# Patient Record
Sex: Male | Born: 1975 | Race: White | Hispanic: No | Marital: Married | Smoking: Never smoker
Health system: Southern US, Community
[De-identification: ages and names within clinical notes are randomized; demographics above are authoritative.]

## PROBLEM LIST (undated history)

## (undated) DIAGNOSIS — K219 Gastro-esophageal reflux disease without esophagitis: Secondary | ICD-10-CM

## (undated) DIAGNOSIS — E119 Type 2 diabetes mellitus without complications: Secondary | ICD-10-CM

## (undated) DIAGNOSIS — Q780 Osteogenesis imperfecta: Secondary | ICD-10-CM

## (undated) HISTORY — PX: KNEE SURGERY: SHX244

## (undated) HISTORY — PX: ANKLE SURGERY: SHX546

## (undated) HISTORY — PX: ELBOW SURGERY: SHX618

---

## 2015-01-14 ENCOUNTER — Ambulatory Visit (INDEPENDENT_AMBULATORY_CARE_PROVIDER_SITE_OTHER): Payer: Self-pay

## 2015-01-14 ENCOUNTER — Other Ambulatory Visit: Payer: Self-pay | Admitting: Adult Health

## 2015-01-14 DIAGNOSIS — R52 Pain, unspecified: Secondary | ICD-10-CM

## 2015-01-14 DIAGNOSIS — M549 Dorsalgia, unspecified: Secondary | ICD-10-CM

## 2015-02-01 ENCOUNTER — Emergency Department (INDEPENDENT_AMBULATORY_CARE_PROVIDER_SITE_OTHER): Payer: Worker's Compensation

## 2015-02-01 ENCOUNTER — Encounter: Payer: Self-pay | Admitting: Emergency Medicine

## 2015-02-01 ENCOUNTER — Emergency Department (INDEPENDENT_AMBULATORY_CARE_PROVIDER_SITE_OTHER)
Admission: EM | Admit: 2015-02-01 | Discharge: 2015-02-01 | Disposition: A | Discharge status: Home / Self Care | Payer: Worker's Compensation | Source: Home / Self Care | Attending: Emergency Medicine | Admitting: Emergency Medicine

## 2015-02-01 DIAGNOSIS — M5441 Lumbago with sciatica, right side: Secondary | ICD-10-CM

## 2015-02-01 DIAGNOSIS — M5416 Radiculopathy, lumbar region: Secondary | ICD-10-CM

## 2015-02-01 DIAGNOSIS — M5417 Radiculopathy, lumbosacral region: Secondary | ICD-10-CM | POA: Diagnosis not present

## 2015-02-01 DIAGNOSIS — M4186 Other forms of scoliosis, lumbar region: Secondary | ICD-10-CM

## 2015-02-01 HISTORY — DX: Osteogenesis imperfecta: Q78.0

## 2015-02-01 MED ORDER — KETOROLAC TROMETHAMINE 60 MG/2ML IM SOLN
60.0000 mg | Freq: Once | INTRAMUSCULAR | Status: AC
  Administered 2015-02-01: 60 mg via INTRAMUSCULAR

## 2015-02-01 MED ORDER — MELOXICAM 7.5 MG PO TABS: ORAL_TABLET | ORAL | Status: AC

## 2015-02-01 MED ORDER — PREDNISONE (PAK) 10 MG PO TABS: ORAL_TABLET | ORAL | Status: AC

## 2015-02-01 MED ORDER — CYCLOBENZAPRINE HCL 10 MG PO TABS: 10.0000 mg | ORAL_TABLET | Freq: Three times a day (TID) | ORAL | Status: DC | PRN

## 2015-02-01 MED ORDER — HYDROCODONE-ACETAMINOPHEN 5-325 MG PO TABS: 1.0000 | ORAL_TABLET | ORAL | Status: AC | PRN

## 2015-02-01 MED ORDER — METHYLPREDNISOLONE ACETATE 80 MG/ML IJ SUSP
80.0000 mg | Freq: Once | INTRAMUSCULAR | Status: AC
  Administered 2015-02-01: 80 mg via INTRAMUSCULAR

## 2015-02-01 NOTE — ED Provider Notes (Signed)
CSN: [id number]     Arrival date & time 02/01/15  1713 History   First MD Initiated Contact with Patient 02/01/15 1717     Chief Complaint  Patient presents with  . Back Pain   Worker's Comp.--Employee of [employer] [facility]:30 [facility]. His wife brings him in. Marland Kitchen. HPI Complicated history. [facility] [employer] Visits, 3/15, 3/17, and 01/21/2015 in [facility].  Date of injury: 01/13/2015 Original injury: 01/13/2015, was working as Proofreader"bulk customer representative", and he was moving a case of 2 L from pallet that was waist high. He sustained immediate right lumbar pain  Patient had back injury at work on 01-13-15; came for evaluation in employer health services 01-14-15, which included xrays of LS-spine showing no acute abnormality. He improved with conservative treatment with medication regime with subsequent back exercises; he gradually improved and was discharged to work on 01/21/2015. Ever since 01/21/2015, he has continued to work with lifting and stacking cases of [employer] and was doing fairly well, although he states the right lumbar pain never completely went away. Up until 2 days ago, he was able to work and function without any significant pain or any neurologic symptoms and was not taking any pain medicine or muscle relaxant at that point.  Then, over past 2 days, complains of worsening midline lumbar pain and right lumbar pain, radiating to right knee. Associated with vague numbness down right leg to right foot.  Currently, lumbar pain is dull, constant, 7 out of 10 intensity at rest. If he bends or twists, the lumbar pain becomes worse, 9 out of 10 intensity, feels sharp radiating pain down right leg to right knee. He resume taking Flexeril last night, and that helped muscle spasm mildly. Took Tylenol this morning without significant relief.  Associated symptoms: He denies bowel or bladder  dysfunction. No chest pain or shortness of breath. No fever or chills or nausea or vomiting or abdominal pain. Past Medical History  Diagnosis Date  . Osteogenesis imperfecta     mild   Past Surgical History  Procedure Laterality Date  . Knee surgery    . Ankle surgery    . Elbow surgery Left    Family History  Problem Relation Age of Onset  . Diabetes Father    History  Substance Use Topics  . Smoking status: Never Smoker   . Smokeless tobacco: Not on file  . Alcohol Use: No    Review of Systems  All other systems reviewed and are negative.   Allergies  Review of patient's allergies indicates no known allergies.  Home Medications   Prior to Admission medications   Medication Sig Start Date End Date Taking? Authorizing Provider  cyclobenzaprine (FLEXERIL) 10 MG tablet Take 1 tablet (10 mg total) by mouth 3 (three) times daily as needed for muscle spasms. Caution-may cause drowsiness 02/01/15   Lajean Manesavid Massey, MD  HYDROcodone-acetaminophen (NORCO/VICODIN) 5-325 MG per tablet Take 1-2 tablets by mouth every 4 (four) hours as needed for severe pain. Take with food. Caution-may cause drowsiness 02/01/15   Lajean Manesavid Massey, MD  meloxicam (MOBIC) 7.5 MG tablet Take 1 twice a day as needed for pain. Take with food. (Do not take with any other NSAID.) 02/01/15   Lajean Manesavid Massey, MD  predniSONE (STERAPRED UNI-PAK) 10 MG tablet Starting Sunday 02/02/2015. Take as directed for 6 days.--Take 6 on day 1, 5 on day 2, 4 on day 3, then 3  tablets on day 4, then 2 tablets on day 5, then 1 on day 6. 02/01/15   Lajean Manes, MD   BP 112/72 mmHg  Pulse 86  Temp(Src) 97.9 F (36.6 C) (Oral)  Resp 6  SpO2 100% Physical Exam  Constitutional: He appears well-developed and well-nourished. He is cooperative.  Non-toxic appearance. He appears distressed (Appears uncomfortable from low back pain.).  In severe pain from mid lumbar and right lumbar pain. Walks hunched over in severe distress from the pain.  HENT:   Head: Normocephalic and atraumatic.  Mouth/Throat: Oropharynx is clear and moist.  Eyes: EOM are normal. Pupils are equal, round, and reactive to light. No scleral icterus.  Neck: Neck supple.  Cardiovascular: Regular rhythm and normal heart sounds.   Pulmonary/Chest: Effort normal and breath sounds normal. No respiratory distress. He has no wheezes. He has no rales. He exhibits no tenderness.  Abdominal: Soft. There is no tenderness.  Musculoskeletal:       Right hip: Normal.       Left hip: Normal.       Cervical back: He exhibits no tenderness.       Thoracic back: He exhibits no tenderness.       Lumbar back: He exhibits decreased range of motion, tenderness, bony tenderness and spasm. He exhibits no swelling, no edema, no deformity, no laceration and normal pulse.  POSITIVE Right straight leg-raise test. Negative Left straight leg-raise test.  Negative Right Patrick test. Negative Left Luisa Hart test.  Very tender with spasm right paralumbar muscles. Very tender right sciatic notch.    Neurological: He is alert. He has normal strength. He displays no atrophy, no tremor and normal reflexes. No cranial nerve deficit or sensory deficit. He exhibits normal muscle tone. Gait abnormal.  Reflex Scores:      Patellar reflexes are 2+ on the right side and 2+ on the left side.      Achilles reflexes are 1+ on the right side and 1+ on the left side. Skin: Skin is warm, dry and intact. No lesion and no rash noted.  Psychiatric: He has a normal mood and affect.  Nursing note and vitals reviewed.   ED Course  Procedures (including critical care time)  Imaging Review Dg Lumbar Spine Complete  02/01/2015   CLINICAL DATA:  Back injury at work on 01/13/2015, now with recurrent pain and right lower extremity radiculopathy beginning yesterday.  EXAM: LUMBAR SPINE - COMPLETE 4+ VIEW  COMPARISON:  01/14/2015  FINDINGS: There is moderate left convex curvature centered at L3. The curvature is  worsened from 01/13/2015. The lumbar vertebrae are normal in height. There is excellent preservation of intervertebral disc spaces. There is no spondylolysis or spondylolisthesis. Sacroiliac joints appear unremarkable. There is no fracture. There is no bone lesion or bony destruction  IMPRESSION: Moderate left convex curvature.   Electronically Signed   By: Ellery Plunk M.D.   On: 02/01/2015 18:30     MDM   1. Acute right-sided back pain with sciatica   2. Lumbar back pain with radiculopathy affecting right lower extremity    although he has no definite neurologic deficits, he has severe lumbar pain, radiating to right leg, consistent with radiculopathy and sciatica.  X-ray LS-spine ordered 5:45 pm. Reviewed with patient and wife that x-ray LS-spine shows no acute bony abnormalities. There is excellent preservation of intervertebral disc spaces. No fracture. There is moderate left convex curvature, which in my opinion is consistent with severe lumbar spasm. Treatment options discussed,  as well as risks, benefits, alternatives. Patient voiced understanding and agreement with the following plans:  Depo-Medrol 80 IM stat Toradol 60 mg IM stat.  Discharge Medication List as of 02/01/2015  6:29 PM    START taking these medications   Details  cyclobenzaprine (FLEXERIL) 10 MG tablet Take 1 tablet (10 mg total) by mouth 3 (three) times daily as needed for muscle spasms. Caution-may cause drowsiness, Starting 02/01/2015, Until Discontinued, Print    HYDROcodone-acetaminophen (NORCO/VICODIN) 5-325 MG per tablet Take 1-2 tablets by mouth every 4 (four) hours as needed for severe pain. Take with food. Caution-may cause drowsiness, Starting 02/01/2015, Until Discontinued, Print    meloxicam (MOBIC) 7.5 MG tablet Take 1 twice a day as needed for pain. Take with food. (Do not take with any other NSAID.), Print    predniSONE (STERAPRED UNI-PAK) 10 MG tablet Starting Sunday 02/02/2015. Take as directed for 6  days.--Take 6 on day 1, 5 on day 2, 4 on day 3, then 3 tablets on day 4, then 2 tablets on day 5, then 1 on day 6., Print       alternate ice and heat. Completed the abbreviated Worker's Comp. physician worksheet-urgent care form. Gave patient a copy of this. Return to work: Undetermined. No work until evaluated by specialist. I wrote a handwritten referral on prescription pad indicating referral to orthopedist within 5 days because of severe right sciatica in severe lumbar pain radiating to right leg. Advised him to contact employer health services on Monday 02/03/2015, who will help arrange referral to orthopedist.  Over 45 minutes spent, greater than 50% of the time spent for counseling and coordination of care. Precautions discussed. Red flags discussed. Questions invited and answered. Patient voiced understanding and agreement.    Lajean Manes, MD 02/01/15 1919

## 2015-02-01 NOTE — ED Notes (Signed)
Patient had back injury at work on 01-13-15; came for evaluation in [facility] 01-14-15, which included xrays of area and medication regime with subsequent exercises; was discharged to return to work 10 days ago. Has not been taking rx for flexeril and diazepam until back began hurting last night; shooting down right leg; making all movements painful; took flexeril last night and tylenol this morning.

## 2015-02-27 ENCOUNTER — Other Ambulatory Visit: Payer: Self-pay | Admitting: Neurological Surgery

## 2015-02-27 DIAGNOSIS — M5126 Other intervertebral disc displacement, lumbar region: Secondary | ICD-10-CM

## 2015-03-04 ENCOUNTER — Ambulatory Visit
Admission: RE | Admit: 2015-03-04 | Discharge: 2015-03-04 | Disposition: A | Discharge status: Home / Self Care | Payer: Worker's Compensation | Source: Ambulatory Visit | Attending: Neurological Surgery | Admitting: Neurological Surgery

## 2015-03-04 DIAGNOSIS — M5126 Other intervertebral disc displacement, lumbar region: Secondary | ICD-10-CM

## 2015-03-04 MED ORDER — METHYLPREDNISOLONE ACETATE 40 MG/ML INJ SUSP (RADIOLOG
120.0000 mg | Freq: Once | INTRAMUSCULAR | Status: AC
  Administered 2015-03-04: 120 mg via EPIDURAL

## 2015-03-04 MED ORDER — IOHEXOL 180 MG/ML  SOLN
1.0000 mL | Freq: Once | INTRAMUSCULAR | Status: AC | PRN
  Administered 2015-03-04: 1 mL via EPIDURAL

## 2015-03-04 NOTE — Discharge Instructions (Signed)

## 2016-08-16 ENCOUNTER — Ambulatory Visit (INDEPENDENT_AMBULATORY_CARE_PROVIDER_SITE_OTHER): Payer: PRIVATE HEALTH INSURANCE | Admitting: Podiatry

## 2016-08-16 VITALS — BP 169/92 | HR 73 | Resp 14

## 2016-08-16 DIAGNOSIS — M79676 Pain in unspecified toe(s): Secondary | ICD-10-CM

## 2016-08-16 DIAGNOSIS — W450XXA Nail entering through skin, initial encounter: Secondary | ICD-10-CM | POA: Diagnosis not present

## 2016-08-16 NOTE — Progress Notes (Signed)
   Subjective:    Patient ID: Albert Burnett, male    DOB: 10/28/1976, 40 y.o.   MRN: [id number]  HPI this patient presents to the office with chief complaint of a great toenail that is unattached from the nail bed. He says he was working at [employer] and a pallet fell onto his toe. He says that he found bleeding noted at the site of the injury 4 weeks ago, but there was minimal pain now. He presents saying that the hole nail has become loose and he is concerned about catching his nail. during the course of the day. He presents the office today for an evaluation and treatment of his big toenail, left foot    Review of Systems  All other systems reviewed and are negative.      Objective:   Physical Exam GENERAL APPEARANCE: Alert, conversant. Appropriately groomed. No acute distress.  VASCULAR: Pedal pulses are  palpable at  [facility] bilateral.  Capillary refill time is immediate to all digits,  Normal temperature gradient.  Digital hair growth is present bilateral  NEUROLOGIC: sensation is normal to 5.07 monofilament at 5/5 sites bilateral.  Light touch is intact bilateral, Muscle strength normal.  MUSCULOSKELETAL: acceptable muscle strength, tone and stability bilateral.  Intrinsic muscluature intact bilateral.  Mild DJD 1st MPJ  Right foot  DERMATOLOGIC: skin color, texture, and turgor are within normal limits.  No preulcerative lesions or ulcers  are seen, no interdigital maceration noted.  No open lesions present.  . No drainage noted. Left hallux nail plate unattached to nail bed.         Assessment & Plan:  Injured toenail left foot   IE  Removal of unattached nail.  Told to peroxide wash.   Helane GuntherGregory Mayer DPM

## 2017-06-01 ENCOUNTER — Ambulatory Visit (INDEPENDENT_AMBULATORY_CARE_PROVIDER_SITE_OTHER): Payer: Worker's Compensation

## 2017-06-01 ENCOUNTER — Other Ambulatory Visit: Payer: Self-pay | Admitting: Emergency Medicine

## 2017-06-01 DIAGNOSIS — M5441 Lumbago with sciatica, right side: Secondary | ICD-10-CM

## 2017-06-01 DIAGNOSIS — M545 Low back pain: Secondary | ICD-10-CM | POA: Diagnosis not present

## 2017-07-13 ENCOUNTER — Other Ambulatory Visit: Payer: Self-pay | Admitting: Neurological Surgery

## 2017-07-13 DIAGNOSIS — M5126 Other intervertebral disc displacement, lumbar region: Secondary | ICD-10-CM

## 2017-07-18 ENCOUNTER — Ambulatory Visit
Admission: RE | Admit: 2017-07-18 | Discharge: 2017-07-18 | Disposition: A | Discharge status: Home / Self Care | Payer: Worker's Compensation | Source: Ambulatory Visit | Attending: Neurological Surgery | Admitting: Neurological Surgery

## 2017-07-18 DIAGNOSIS — M5126 Other intervertebral disc displacement, lumbar region: Secondary | ICD-10-CM

## 2017-07-18 MED ORDER — IOPAMIDOL (ISOVUE-M 200) INJECTION 41%
1.0000 mL | Freq: Once | INTRAMUSCULAR | Status: AC
  Administered 2017-07-18: 1 mL via EPIDURAL

## 2017-07-18 MED ORDER — METHYLPREDNISOLONE ACETATE 40 MG/ML INJ SUSP (RADIOLOG
120.0000 mg | Freq: Once | INTRAMUSCULAR | Status: AC
  Administered 2017-07-18: 120 mg via EPIDURAL

## 2017-07-18 NOTE — Discharge Instructions (Signed)

## 2017-09-20 ENCOUNTER — Other Ambulatory Visit: Payer: Self-pay | Admitting: Neurological Surgery

## 2017-09-20 DIAGNOSIS — M5126 Other intervertebral disc displacement, lumbar region: Secondary | ICD-10-CM

## 2017-10-06 ENCOUNTER — Ambulatory Visit
Admission: RE | Admit: 2017-10-06 | Discharge: 2017-10-06 | Disposition: A | Discharge status: Home / Self Care | Payer: Worker's Compensation | Source: Ambulatory Visit | Attending: Neurological Surgery | Admitting: Neurological Surgery

## 2017-10-06 DIAGNOSIS — M5126 Other intervertebral disc displacement, lumbar region: Secondary | ICD-10-CM

## 2017-10-06 MED ORDER — METHYLPREDNISOLONE ACETATE 40 MG/ML INJ SUSP (RADIOLOG
120.0000 mg | Freq: Once | INTRAMUSCULAR | Status: AC
  Administered 2017-10-06: 120 mg via EPIDURAL

## 2017-10-06 MED ORDER — IOPAMIDOL (ISOVUE-M 200) INJECTION 41%
1.0000 mL | Freq: Once | INTRAMUSCULAR | Status: AC
  Administered 2017-10-06: 1 mL via EPIDURAL

## 2017-10-06 NOTE — Discharge Instructions (Signed)

## 2018-08-30 IMAGING — DX DG LUMBAR SPINE 2-3V
3 series · 3 of 3 positions shown · non-contrast
Comparison: 02/13/2015

CLINICAL DATA: Low back pain for 5 days

EXAM:
LUMBAR SPINE - 2-3 VIEW

[l-spine ap]
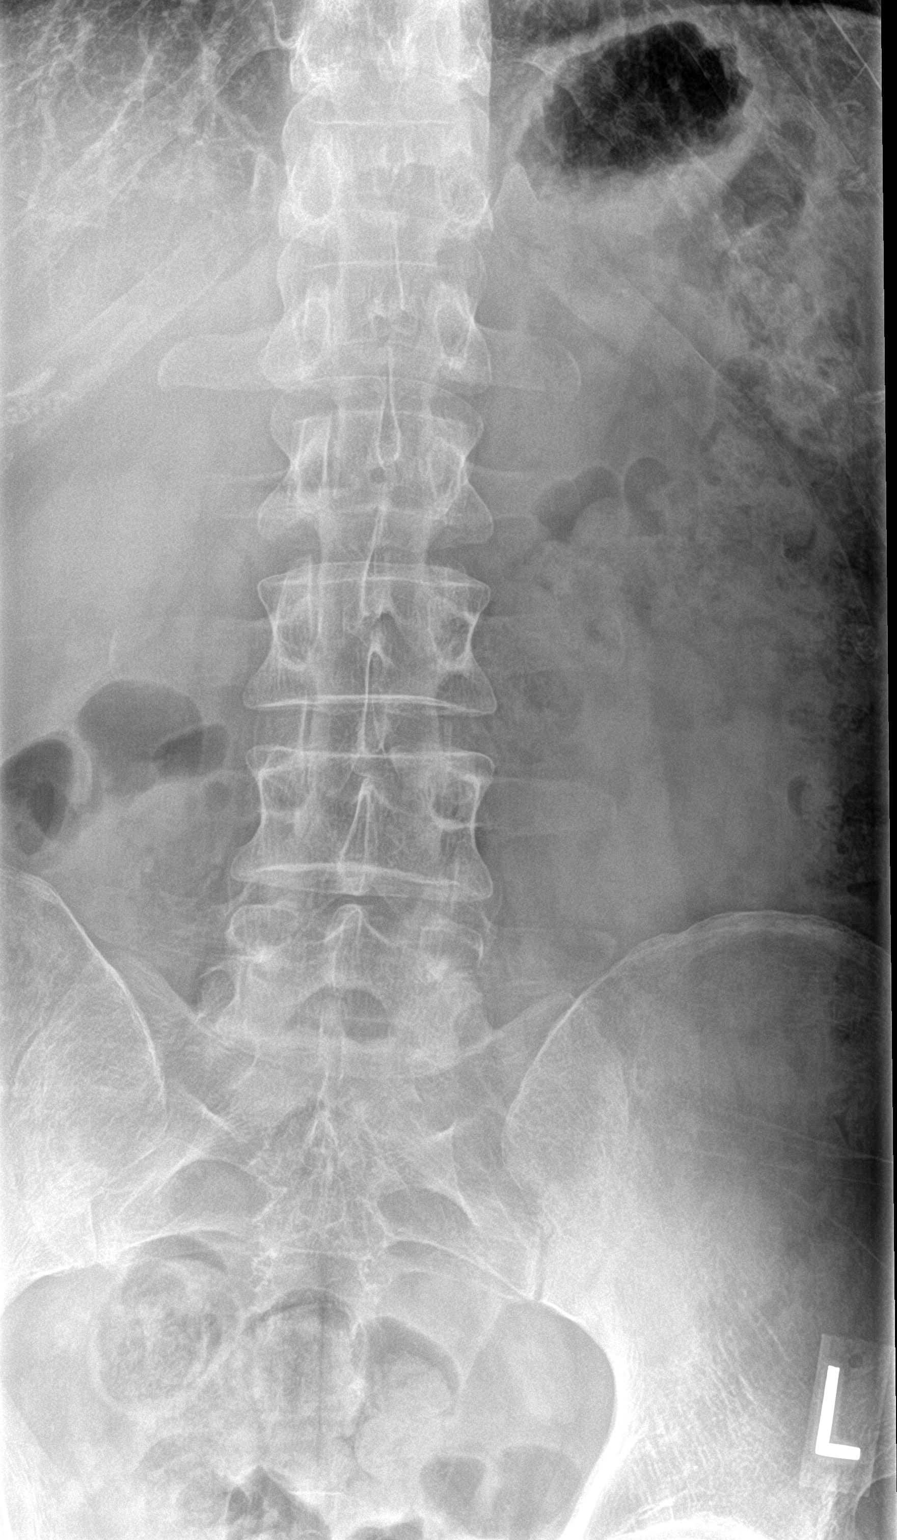

[l-spine lat]
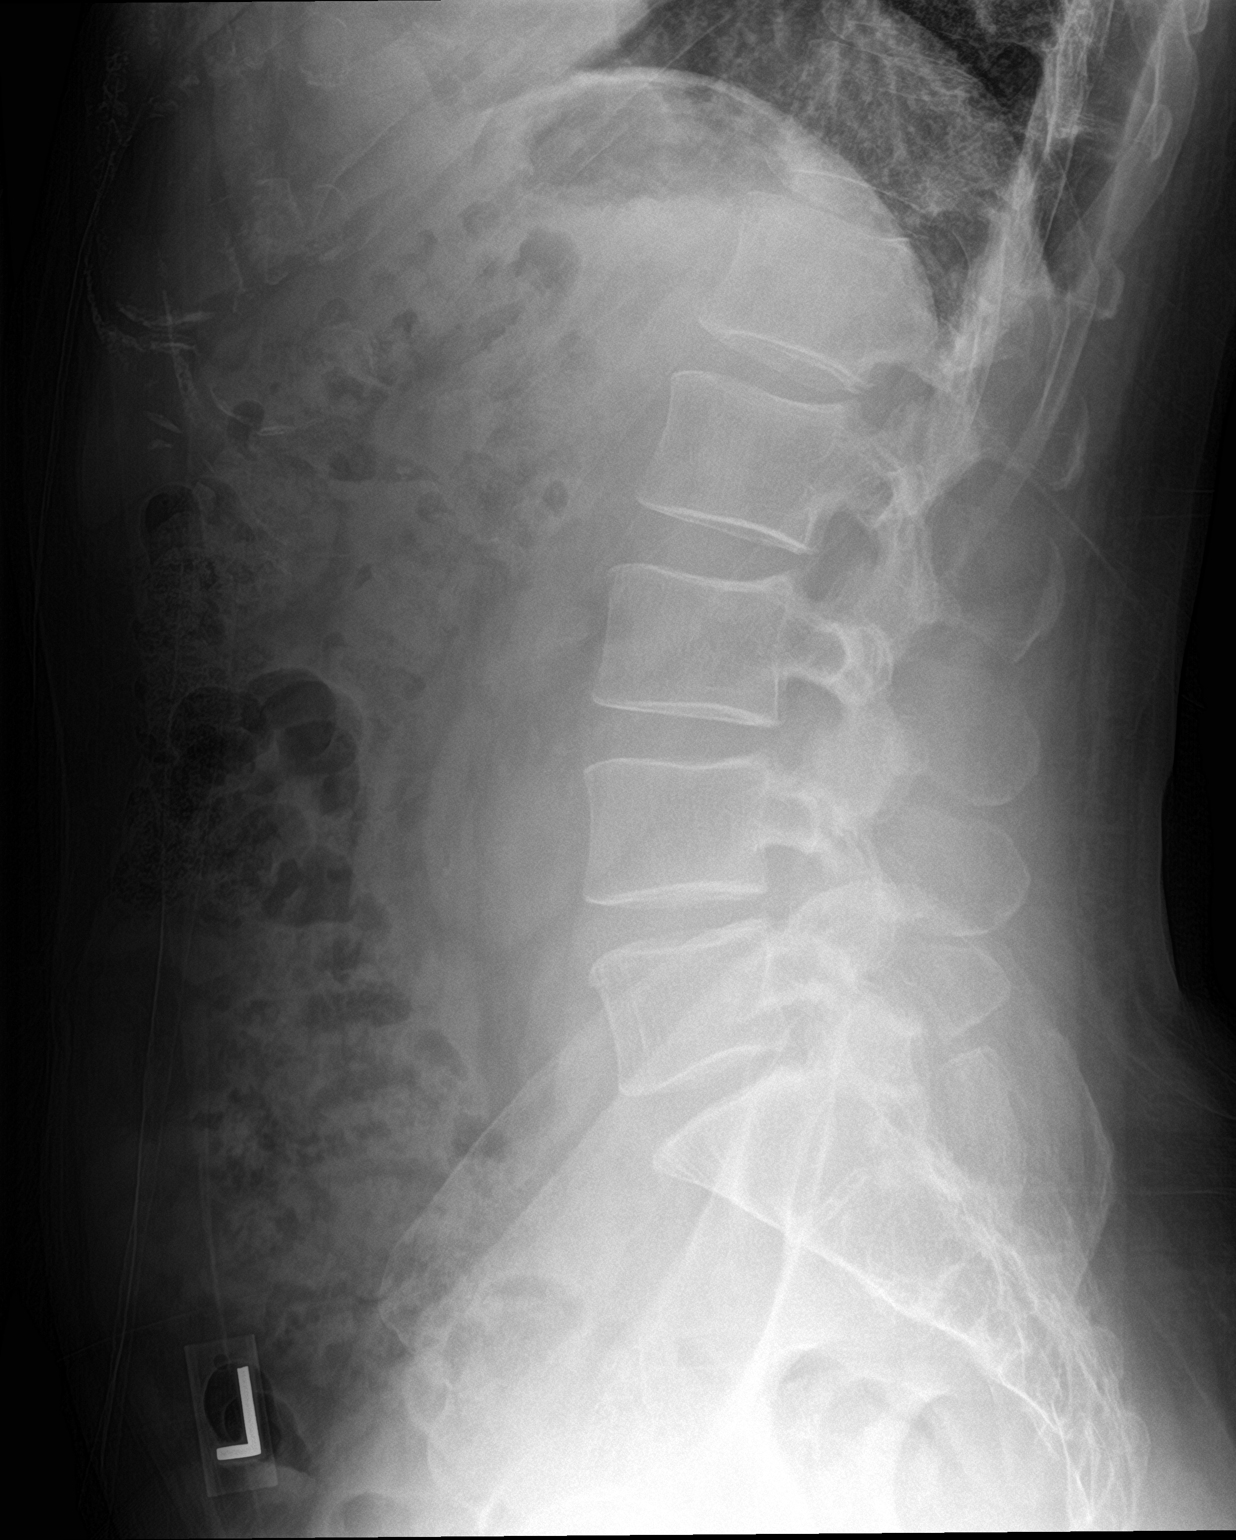

[l-spine spot]
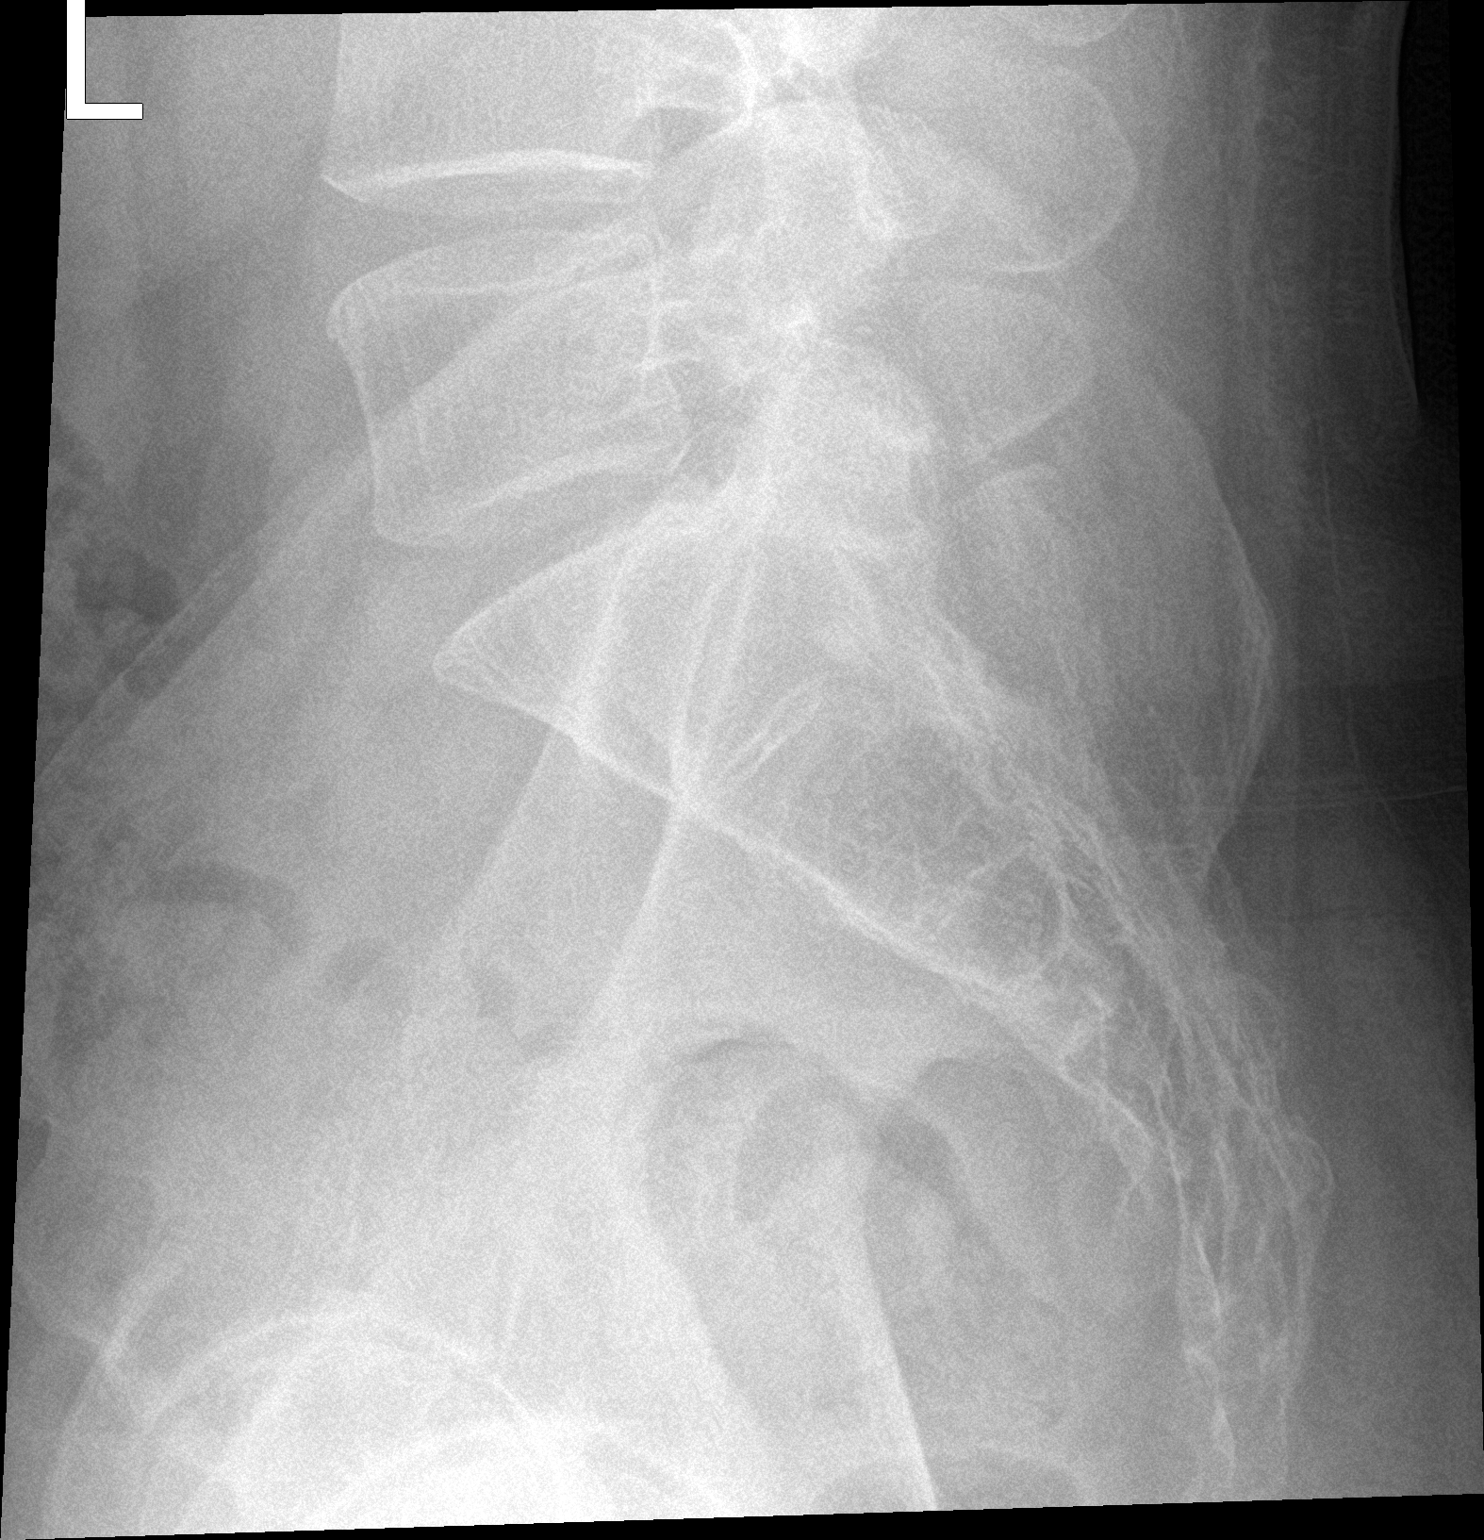

[3 of 3 positions shown; findings below may reference images not displayed]

FINDINGS: Anatomic alignment. No vertebral compression. Moderate narrowing of
the L4-5 and L5-S1 discs. No definite fracture. There is facet
arthropathy at L4-5 and L5-S1.
IMPRESSION: No acute bony pathology.  Degenerative change.

## 2021-06-19 ENCOUNTER — Ambulatory Visit: Payer: Commercial Managed Care - PPO | Admitting: Sports Medicine

## 2021-06-19 ENCOUNTER — Other Ambulatory Visit: Payer: Self-pay

## 2021-06-19 ENCOUNTER — Encounter: Payer: Self-pay | Admitting: Sports Medicine

## 2021-06-19 DIAGNOSIS — L0390 Cellulitis, unspecified: Secondary | ICD-10-CM | POA: Insufficient documentation

## 2021-06-19 DIAGNOSIS — K5900 Constipation, unspecified: Secondary | ICD-10-CM | POA: Insufficient documentation

## 2021-06-19 DIAGNOSIS — G43019 Migraine without aura, intractable, without status migrainosus: Secondary | ICD-10-CM | POA: Insufficient documentation

## 2021-06-19 DIAGNOSIS — K921 Melena: Secondary | ICD-10-CM | POA: Insufficient documentation

## 2021-06-19 DIAGNOSIS — M2041 Other hammer toe(s) (acquired), right foot: Secondary | ICD-10-CM

## 2021-06-19 DIAGNOSIS — E782 Mixed hyperlipidemia: Secondary | ICD-10-CM | POA: Insufficient documentation

## 2021-06-19 DIAGNOSIS — H9190 Unspecified hearing loss, unspecified ear: Secondary | ICD-10-CM | POA: Insufficient documentation

## 2021-06-19 DIAGNOSIS — M2042 Other hammer toe(s) (acquired), left foot: Secondary | ICD-10-CM

## 2021-06-19 DIAGNOSIS — E119 Type 2 diabetes mellitus without complications: Secondary | ICD-10-CM | POA: Insufficient documentation

## 2021-06-19 DIAGNOSIS — U071 COVID-19: Secondary | ICD-10-CM | POA: Insufficient documentation

## 2021-06-19 DIAGNOSIS — M5416 Radiculopathy, lumbar region: Secondary | ICD-10-CM | POA: Diagnosis not present

## 2021-06-19 DIAGNOSIS — W57XXXA Bitten or stung by nonvenomous insect and other nonvenomous arthropods, initial encounter: Secondary | ICD-10-CM | POA: Insufficient documentation

## 2021-06-19 DIAGNOSIS — K219 Gastro-esophageal reflux disease without esophagitis: Secondary | ICD-10-CM | POA: Insufficient documentation

## 2021-06-19 DIAGNOSIS — G629 Polyneuropathy, unspecified: Secondary | ICD-10-CM | POA: Diagnosis not present

## 2021-06-19 DIAGNOSIS — Z0000 Encounter for general adult medical examination without abnormal findings: Secondary | ICD-10-CM | POA: Insufficient documentation

## 2021-06-19 DIAGNOSIS — E559 Vitamin D deficiency, unspecified: Secondary | ICD-10-CM | POA: Insufficient documentation

## 2021-06-19 DIAGNOSIS — D518 Other vitamin B12 deficiency anemias: Secondary | ICD-10-CM | POA: Insufficient documentation

## 2021-06-19 DIAGNOSIS — R7303 Prediabetes: Secondary | ICD-10-CM | POA: Diagnosis not present

## 2021-06-19 DIAGNOSIS — E039 Hypothyroidism, unspecified: Secondary | ICD-10-CM | POA: Insufficient documentation

## 2021-06-19 DIAGNOSIS — R1310 Dysphagia, unspecified: Secondary | ICD-10-CM | POA: Insufficient documentation

## 2021-06-19 DIAGNOSIS — I10 Essential (primary) hypertension: Secondary | ICD-10-CM | POA: Insufficient documentation

## 2021-06-19 DIAGNOSIS — L0291 Cutaneous abscess, unspecified: Secondary | ICD-10-CM | POA: Insufficient documentation

## 2021-06-19 NOTE — Patient Instructions (Signed)
Nervive nerve relief supplement for your nerves can be purchased OTC at [facility]  

## 2021-06-19 NOTE — Progress Notes (Signed)
Subjective: Albert Burnett is a 45 y.o. male patient who presents to office for evaluation of Right> Left foot pain. Patient complains of progressive tingling and burning to the toes for the last month. Reports that he is borderline diabetic on Metformin and hammertoes and neuropathy runs in his family. Patient denies any other pedal complaints.   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 Caution-may cause drowsiness (Patient not taking: Reported on 08/16/2016) 21 tablet 0   HYDROcodone-acetaminophen (NORCO/VICODIN) 5-325 MG per tablet Take 1-2 tablets by mouth every 4 (four) hours as needed for severe pain. Take with food. Caution-may cause drowsiness (Patient not taking: Reported on 08/16/2016) 12 tablet 0   ibuprofen (ADVIL) 800 MG tablet Take 800 mg by mouth 3 (three) times daily.     meloxicam (MOBIC) 7.5 MG tablet Take 1 twice a day as needed for pain. Take with food. (Do not take with any other NSAID.) 20  tablet 0   metFORMIN (GLUCOPHAGE) 500 MG tablet Take 500 mg by mouth daily.     Multiple Vitamin (MULTI VITAMIN) TABS 1 tablet     omeprazole (PRILOSEC) 20 MG capsule Take 20 mg by mouth daily.     predniSONE (STERAPRED UNI-PAK) 10 MG tablet Starting Sunday 02/02/2015. Take as directed for 6 days.--Take 6 on day 1, 5 on day 2, 4 on day 3, then 3 tablets on day 4, then 2 tablets on day 5, then 1 on day 6. (Patient not taking: Reported on 08/16/2016) 21 tablet 0   Vitamin D, Ergocalciferol, (DRISDOL) 1.25 MG (50000 UNIT) CAPS capsule Take 50,000 Units by mouth once a week.     No current facility-administered medications on file prior to visit.    No Known Allergies  Objective:  General: Alert and oriented x3 in no acute distress  Dermatology: Small hyperkeratotic lesion overlying 2-5 PIPJ dorsally bilateral. No open lesions bilateral lower extremities, no webspace macerations, no ecchymosis bilateral, all nails x 10 are well manicured.  Vascular: Dorsalis Pedis and Posterior Tibial pedal pulses 2/4, Capillary Fill Time 3 seconds,(+) pedal hair growth bilateral, no edema bilateral lower extremities, Temperature gradient within normal limits.  Neurology: Michaell Cowing sensation intact via light touch bilateral, Protective sensation intact  with Phoebe Perch Monofilament to all pedal sites, Vibratory diminished bilateral.    Musculoskeletal: Semi-flexible hammertoes 2-5 without tenderness. No bunion deformity noted bilateral. No pain with calf compression bilateral.  Strength within normal limits in all groups bilateral.        Assessment and Plan: Problem List Items Addressed This Visit  None Visit Diagnoses     Neuropathy    -  Primary   Hammer toes of both feet       Lumbar radiculopathy       Prediabetes          -Complete examination performed -Discussed treatment options for burning R>L foot secondary to back and history of pre-diabetes last a1c 6.2 -Recommend montoring -Recommend  OTC Nervive nerve relief vitamin -Recommend good supportive shoes daily for foot type -Patient to return to office as needed or sooner if condition worsens.  Asencion Islam, DPM

## 2022-01-18 ENCOUNTER — Encounter (HOSPITAL_BASED_OUTPATIENT_CLINIC_OR_DEPARTMENT_OTHER): Payer: Self-pay | Admitting: *Deleted

## 2022-01-18 ENCOUNTER — Other Ambulatory Visit: Payer: Self-pay

## 2022-01-18 ENCOUNTER — Emergency Department (HOSPITAL_BASED_OUTPATIENT_CLINIC_OR_DEPARTMENT_OTHER)
Admission: EM | Admit: 2022-01-18 | Discharge: 2022-01-18 | Disposition: A | Discharge status: Home / Self Care | Payer: No Typology Code available for payment source | Attending: Emergency Medicine | Admitting: Emergency Medicine

## 2022-01-18 DIAGNOSIS — R202 Paresthesia of skin: Secondary | ICD-10-CM | POA: Diagnosis not present

## 2022-01-18 DIAGNOSIS — E119 Type 2 diabetes mellitus without complications: Secondary | ICD-10-CM | POA: Insufficient documentation

## 2022-01-18 DIAGNOSIS — M545 Low back pain, unspecified: Secondary | ICD-10-CM | POA: Diagnosis present

## 2022-01-18 DIAGNOSIS — Z7984 Long term (current) use of oral hypoglycemic drugs: Secondary | ICD-10-CM | POA: Diagnosis not present

## 2022-01-18 DIAGNOSIS — M5430 Sciatica, unspecified side: Secondary | ICD-10-CM | POA: Insufficient documentation

## 2022-01-18 DIAGNOSIS — M5440 Lumbago with sciatica, unspecified side: Secondary | ICD-10-CM

## 2022-01-18 HISTORY — DX: Type 2 diabetes mellitus without complications: E11.9

## 2022-01-18 HISTORY — DX: Gastro-esophageal reflux disease without esophagitis: K21.9

## 2022-01-18 MED ORDER — CYCLOBENZAPRINE HCL 10 MG PO TABS
10.0000 mg | ORAL_TABLET | Freq: Once | ORAL | Status: AC
  Administered 2022-01-18: 10 mg via ORAL
  Filled 2022-01-18: qty 1

## 2022-01-18 MED ORDER — CYCLOBENZAPRINE HCL 10 MG PO TABS: 10.0000 mg | ORAL_TABLET | Freq: Two times a day (BID) | ORAL | 0 refills | Status: AC | PRN

## 2022-01-18 MED ORDER — KETOROLAC TROMETHAMINE 60 MG/2ML IM SOLN
60.0000 mg | Freq: Once | INTRAMUSCULAR | Status: AC
  Administered 2022-01-18: 60 mg via INTRAMUSCULAR
  Filled 2022-01-18: qty 2

## 2022-01-18 NOTE — ED Triage Notes (Signed)
Lower back, left thigh and calf pain. His foot has been "asleep" x 3 days. ?

## 2022-01-18 NOTE — ED Provider Notes (Signed)
?[facility] EMERGENCY DEPARTMENT ?Provider Note ? ? ?CSN: [id number] ?Arrival date & time: 01/18/22  1644 ? ?  ? ?History ? ?Chief Complaint  ?Patient presents with  ? Back Pain  ? ? ?Albert Burnett is a 46 y.o. male. ? ?The history is provided by the patient.  ?Back Pain ?Location:  Lumbar spine ?Quality:  Aching ?Radiates to:  L foot ?Pain severity:  Mild ?Onset quality:  Gradual ?Duration:  5 days ?Timing:  Intermittent ?Progression:  Waxing and waning ?Chronicity:  New ?Context: physical stress   ?Relieved by:  Being still (steroids) ?Worsened by:  Ambulation, bending, palpation and stress ?Associated symptoms: paresthesias and tingling   ?Associated symptoms: no abdominal pain, no abdominal swelling, no bladder incontinence, no bowel incontinence, no chest pain, no dysuria, no fever, no headaches, no pelvic pain, no perianal numbness, no weakness and no weight loss   ? ?  ? ?Home Medications ?Prior to Admission medications   ?Medication Sig Start Date End Date Taking? Authorizing Provider  ?cyclobenzaprine (FLEXERIL) 10 MG tablet Take 1 tablet (10 mg total) by mouth 2 (two) times daily as needed for muscle spasms. 01/18/22  Yes Lennice Sites, DO  ?Galcanezumab-gnlm (EMGALITY) 120 MG/ML SOAJ 1 ml 03/24/21   [provider]  ?HYDROcodone-acetaminophen (NORCO/VICODIN) 5-325 MG per tablet Take 1-2 tablets by mouth every 4 (four) hours as needed for severe pain. Take with food. Caution-may cause drowsiness ?Patient not taking: Reported on 08/16/2016 02/01/15   Jacqulyn Cane, MD  ?ibuprofen (ADVIL) 800 MG tablet Take 800 mg by mouth 3 (three) times daily. 12/23/20   [provider]  ?meloxicam (MOBIC) 7.5 MG tablet Take 1 twice a day as needed for pain. Take with food. (Do not take with any other NSAID.) 02/01/15   Jacqulyn Cane, MD  ?metFORMIN (GLUCOPHAGE) 500 MG tablet Take 500 mg by mouth daily. 06/13/21   [provider]  ?Multiple Vitamin (MULTI VITAMIN) TABS 1 tablet    [provider]  ?omeprazole (PRILOSEC) 20 MG capsule Take 20 mg by mouth daily. 06/13/21   [provider]  ?predniSONE (STERAPRED UNI-PAK) 10 MG tablet Starting Sunday 02/02/2015. Take as directed for 6 days.--Take 6 on day 1, 5 on day 2, 4 on day 3, then 3 tablets on day 4, then 2 tablets on day 5, then 1 on day 6. ?Patient not taking: Reported on 08/16/2016 02/01/15   Jacqulyn Cane, MD  ?Vitamin D, Ergocalciferol, (DRISDOL) 1.25 MG (50000 UNIT) CAPS capsule Take 50,000 Units by mouth once a week. 06/13/21   [provider]  ?   ? ?Allergies    ?Patient has no known allergies.   ? ?Review of Systems   ?Review of Systems  ?Constitutional:  Negative for fever and weight loss.  ?Cardiovascular:  Negative for chest pain.  ?Gastrointestinal:  Negative for abdominal pain and bowel incontinence.  ?Genitourinary:  Negative for bladder incontinence, dysuria and pelvic pain.  ?Musculoskeletal:  Positive for back pain.  ?Neurological:  Positive for tingling and paresthesias. Negative for weakness and headaches.  ? ?Physical Exam ?Updated Vital Signs ?BP (!) 141/100 (BP Location: Right Arm)   Pulse 92   Temp 98.1 ?F (36.7 ?C)   Resp 18   Ht 6\' 7"  (2.007 m)   Wt 97.5 kg   SpO2 100%   BMI 24.22 kg/m?  ?Physical Exam ?Vitals and nursing note reviewed.  ?Constitutional:   ?   General: He is not in acute distress. ?   Appearance: He  is well-developed. He is not ill-appearing.  ?HENT:  ?   Head: Normocephalic and atraumatic.  ?Eyes:  ?   Extraocular Movements: Extraocular movements intact.  ?   Conjunctiva/sclera: Conjunctivae normal.  ?   Pupils: Pupils are equal, round, and reactive to light.  ?Cardiovascular:  ?   Rate and Rhythm: Normal rate and regular rhythm.  ?   Pulses: Normal pulses.  ?   Heart sounds: Normal heart sounds. No murmur heard. ?Pulmonary:  ?   Effort: Pulmonary effort is normal. No respiratory distress.  ?   Breath sounds: Normal breath sounds.  ?Abdominal:  ?   Palpations: Abdomen is soft.   ?   Tenderness: There is no abdominal tenderness.  ?Musculoskeletal:     ?   General: Tenderness present. No swelling.  ?   Cervical back: Normal range of motion and neck supple.  ?   Comments: Tenderness to the paraspinal lumbar muscles on the left and left gluteal muscles, no midline spinal tenderness  ?Skin: ?   General: Skin is warm and dry.  ?   Capillary Refill: Capillary refill takes less than 2 seconds.  ?Neurological:  ?   General: No focal deficit present.  ?   Mental Status: He is alert and oriented to person, place, and time.  ?   Cranial Nerves: No cranial nerve deficit.  ?   Sensory: No sensory deficit.  ?   Motor: No weakness.  ?   Coordination: Coordination normal.  ?   Comments: 5+ out of 5 strength throughout, normal sensation  ?Psychiatric:     ?   Mood and Affect: Mood normal.  ? ? ?ED Results / Procedures / Treatments   ?Labs ?(all labs ordered are listed, but only abnormal results are displayed) ?Labs Reviewed - No data to display ? ?EKG ?None ? ?Radiology ?No results found. ? ?Procedures ?Procedures  ? ? ?Medications Ordered in ED ?Medications  ?ketorolac (TORADOL) injection 60 mg (60 mg Intramuscular Given 01/18/22 1739)  ?cyclobenzaprine (FLEXERIL) tablet 10 mg (10 mg Oral Given 01/18/22 1739)  ? ? ?ED Course/ Medical Decision Making/ A&P ?  ?                        ?Medical Decision Making ?Risk ?Prescription drug management. ? ? ?Albert Burnett is here with low back pain.  Pain for the last several days.  Pain shooting down the left leg at times.  Feeling paresthesias in the left foot.  Able to ambulate but with discomfort.  No loss of bowel or bladder.  Has had similar discs issue in his low back but affecting the right side of his back and leg.  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  Started steroids 2 days ago but continues to work and having discomfort.  Neurovascularly he is intact on exam with good pulses in his lower extremities.  He has tenderness to the paraspinal lumbar muscles on the left with  sciatic symptoms on the left side.  He has great strength and sensation throughout his lower extremities.  Differential diagnosis is likely sciatica versus radiculopathy.  I have no concern for cauda equina or epidural abscess at this time.  We will write him out of work and have him follow-up with spine doctor and sports medicine.  Continue to take steroids but will add Tylenol, lidocaine patches, ibuprofen, Flexeril to his regimen.  He understands return precautions if he develops any urinary retention, loss of bowel or bladder  otherwise.  Denies any trauma.  No midline spinal tenderness.  No step-offs.  No need for any imaging at this time.  Discharged in good condition.  Understands return precautions. ? ?This chart was dictated using voice recognition software.  Despite best efforts to proofread,  errors can occur which can change the documentation meaning.  ? ? ? ? ? ? ? ?Final Clinical Impression(s) / ED Diagnoses ?Final diagnoses:  ?Acute left-sided low back pain with sciatica, sciatica laterality unspecified  ? ? ?Rx / DC Orders ?ED Discharge Orders   ? ?      Ordered  ?  cyclobenzaprine (FLEXERIL) 10 MG tablet  2 times daily PRN       ? 01/18/22 1741  ? ?  ?  ? ?  ? ? ?  ?Lennice Sites, DO ?01/18/22 1744 ? ?

## 2022-01-18 NOTE — Discharge Instructions (Signed)
Recommend 1000 mg of Tylenol every 6 hours as needed for pain, recommend 400 mg ibuprofen every 8 hours as needed for pain.  Continue steroids.  Recommend buying over-the-counter lidocaine patches for pain as well.  I have prescribed you Flexeril which is a muscle relaxant.  Do not take this medicine with other drugs or alcohol or heavy machinery use including driving as this medication is sedating.  Follow-up with sports medicine and spine team.  No heavy lifting greater than 10 pounds until cleared by physician. ?
